# Patient Record
Sex: Female | Born: 1945 | Race: White | Hispanic: No
Health system: Southern US, Community
[De-identification: ages and names within clinical notes are randomized; demographics above are authoritative.]

---

## 2009-11-28 ENCOUNTER — Ambulatory Visit (HOSPITAL_COMMUNITY): Admission: RE | Admit: 2009-11-28 | Discharge: 2009-11-28 | Payer: Self-pay | Admitting: Ophthalmology

## 2010-07-14 LAB — CBC
HCT: 39.3 % (ref 36.0–46.0)
Hemoglobin: 13.2 g/dL (ref 12.0–15.0)
MCH: 30.4 pg (ref 26.0–34.0)
MCHC: 33.6 g/dL (ref 30.0–36.0)
Platelets: 232 10*3/uL (ref 150–400)
WBC: 7.3 10*3/uL (ref 4.0–10.5)

## 2010-07-14 LAB — BASIC METABOLIC PANEL
BUN: 8 mg/dL (ref 6–23)
Calcium: 9.4 mg/dL (ref 8.4–10.5)
Chloride: 107 mEq/L (ref 96–112)
Creatinine, Ser: 0.7 mg/dL (ref 0.4–1.2)
GFR calc Af Amer: >60 mL/min (ref >60)
GFR calc non Af Amer: >60 mL/min (ref >60)
Potassium: 4.1 mEq/L (ref 3.5–5.1)

## 2010-07-14 LAB — SURGICAL PCR SCREEN: MRSA, PCR: NEGATIVE

## 2010-07-14 LAB — AUTOLOGOUS SERUM PATCH PREP

## 2011-01-19 IMAGING — CR DG CHEST 2V
2 series · 2 of 2 positions shown · non-contrast
Comparison: None.

CLINICAL DATA: Preop.

CHEST - 2 VIEW

[view not recorded (1 of 2)]
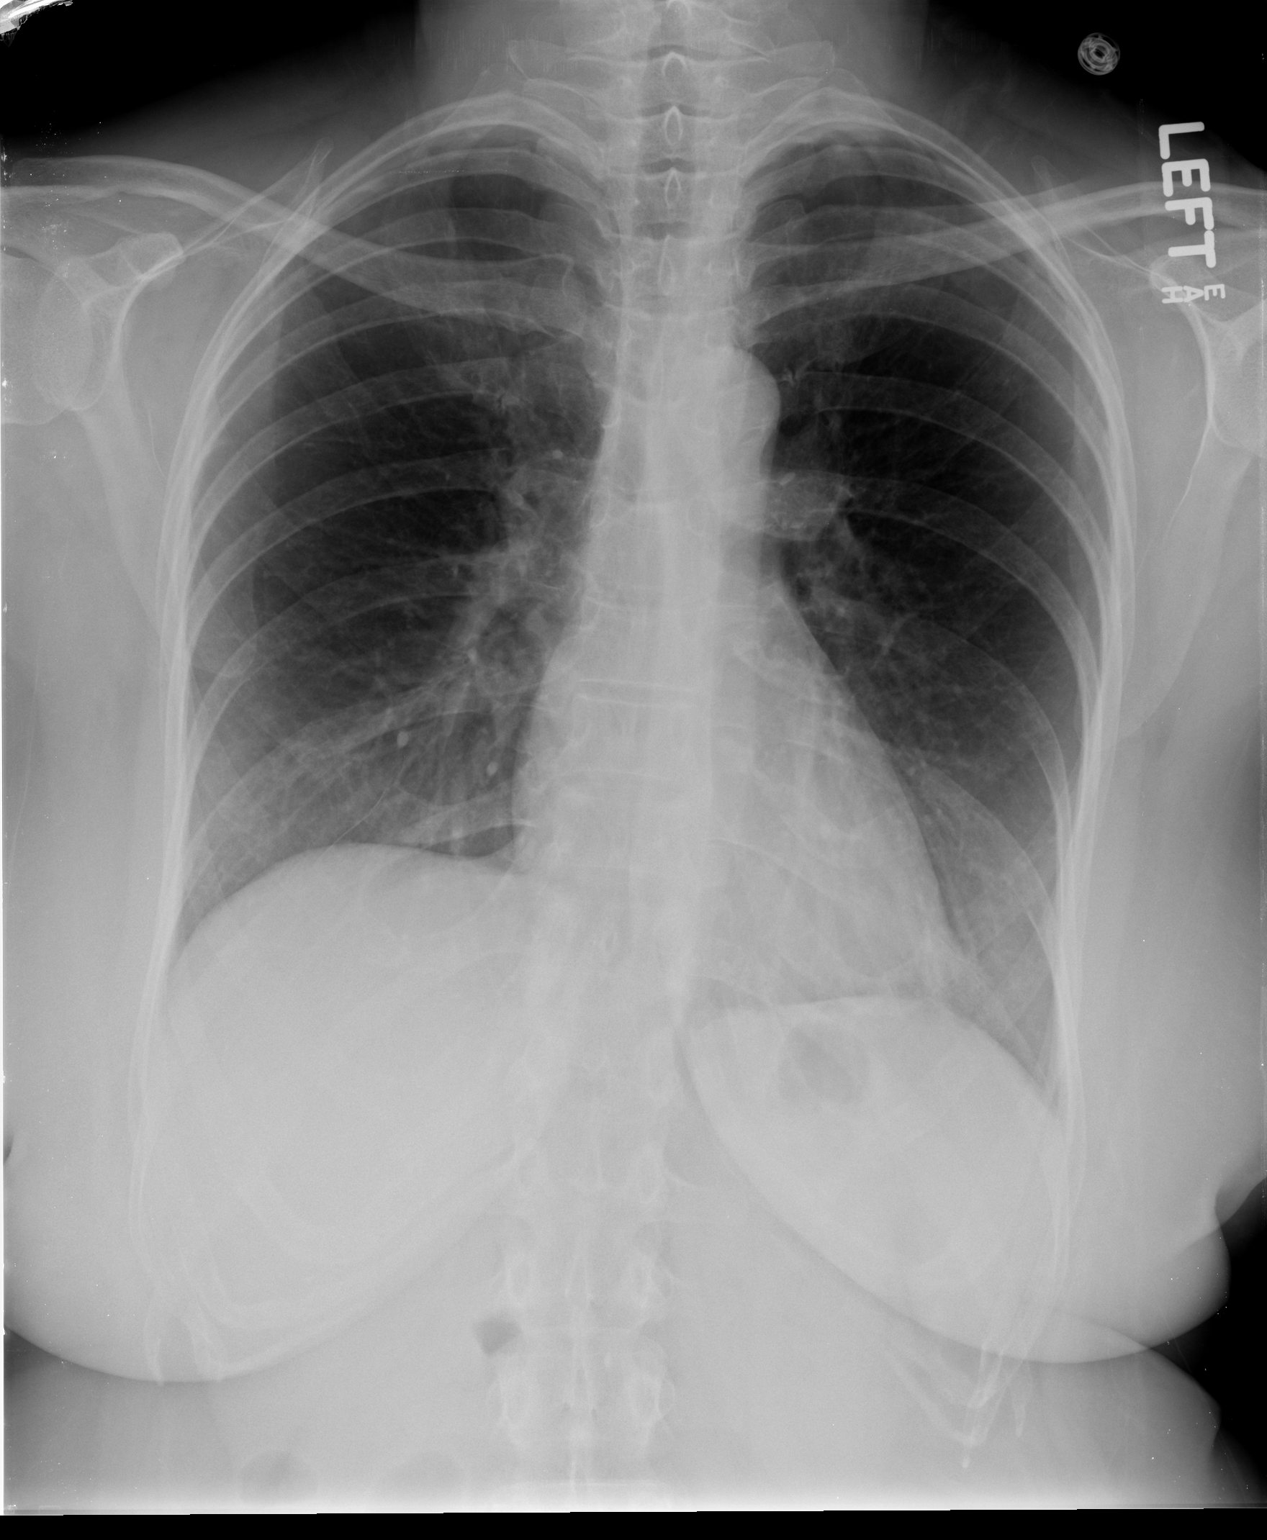

[view not recorded (2 of 2)]
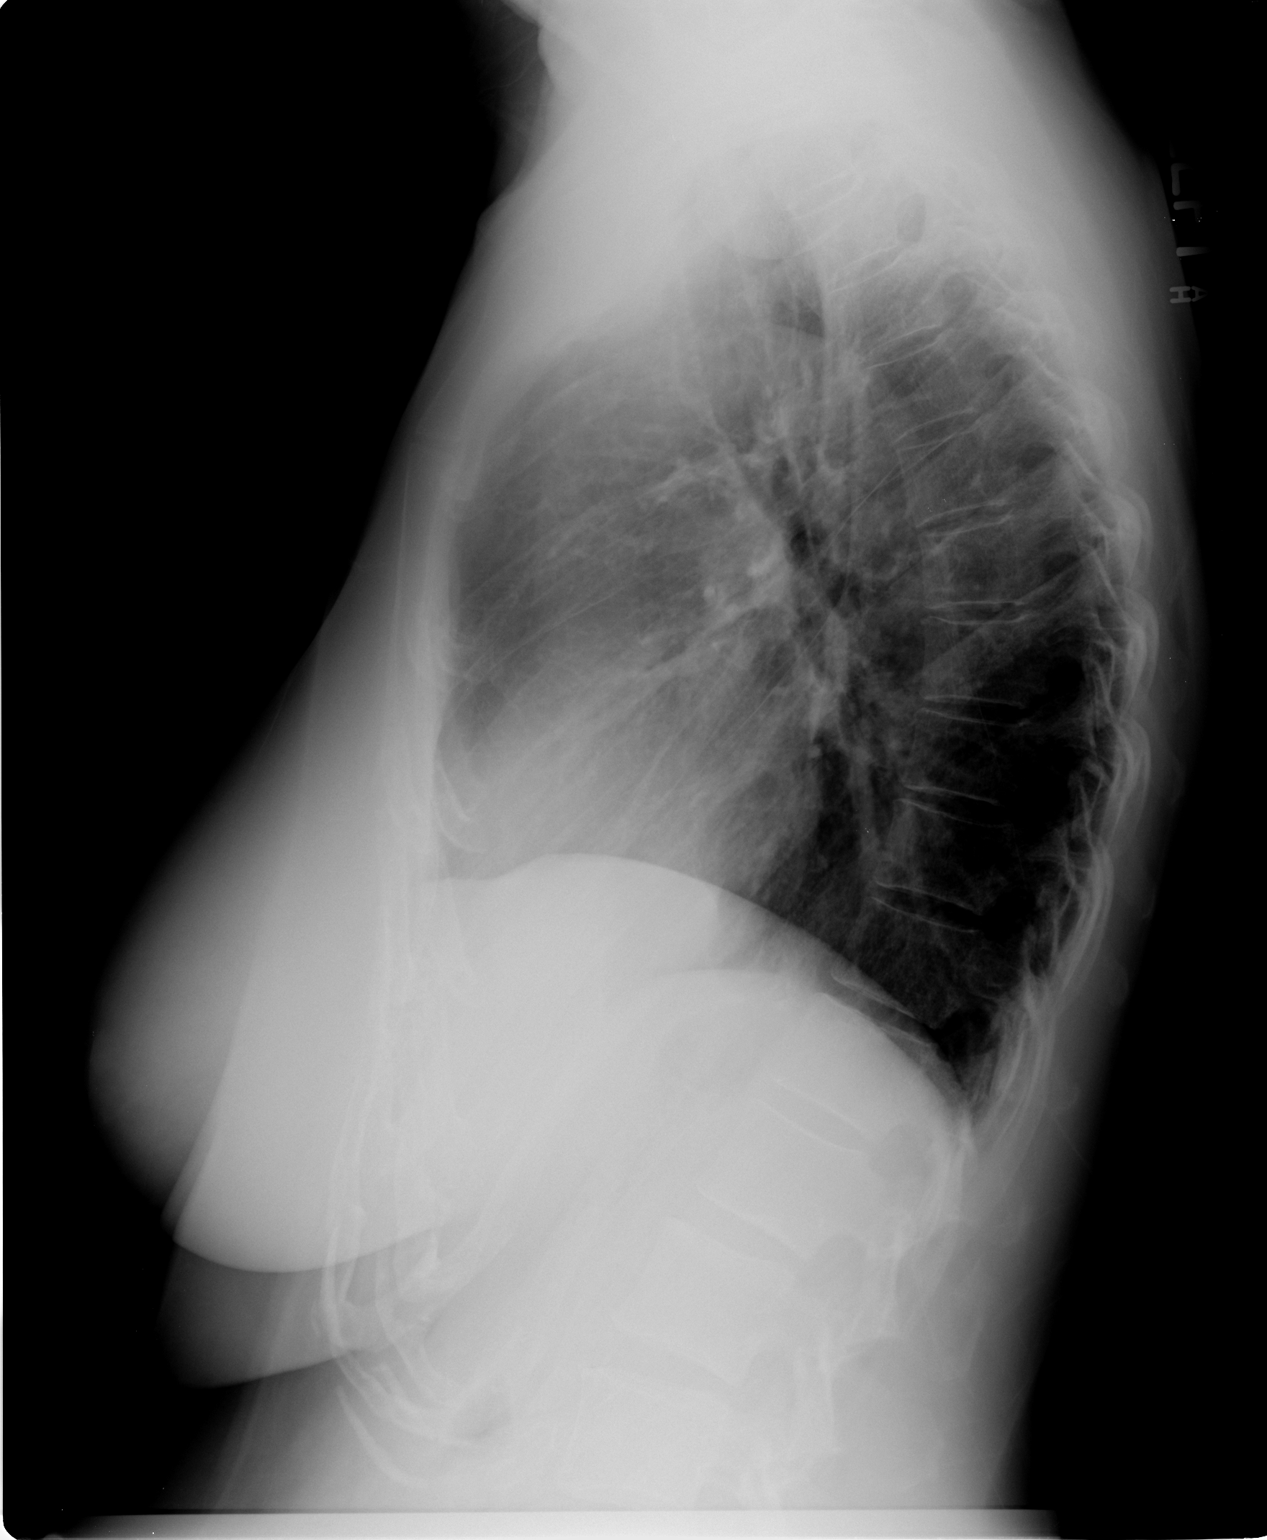

[2 of 2 positions shown; findings below may reference images not displayed]

FINDINGS: Trachea is midline.  Heart size normal.  Minimal biapical
pleural thickening.  Lungs are clear.  No pleural fluid.
IMPRESSION: No acute findings.

## 2022-09-29 DEATH — deceased
